# Patient Record
Sex: Male | Born: 1985 | Race: White | Hispanic: No | Marital: Single | Smoking: Former smoker
Health system: Southern US, Community
[De-identification: ages and names within clinical notes are randomized; demographics above are authoritative.]

## PROBLEM LIST (undated history)

## (undated) DIAGNOSIS — E039 Hypothyroidism, unspecified: Secondary | ICD-10-CM

## (undated) DIAGNOSIS — F4310 Post-traumatic stress disorder, unspecified: Secondary | ICD-10-CM

## (undated) DIAGNOSIS — I4891 Unspecified atrial fibrillation: Secondary | ICD-10-CM

## (undated) HISTORY — DX: Hypothyroidism, unspecified: E03.9

---

## 2012-06-22 ENCOUNTER — Ambulatory Visit: Payer: Self-pay | Admitting: Family Medicine

## 2012-06-26 ENCOUNTER — Other Ambulatory Visit: Payer: Self-pay | Admitting: *Deleted

## 2012-06-26 ENCOUNTER — Ambulatory Visit (INDEPENDENT_AMBULATORY_CARE_PROVIDER_SITE_OTHER): Admitting: Family Medicine

## 2012-06-26 ENCOUNTER — Encounter: Payer: Self-pay | Admitting: Family Medicine

## 2012-06-26 VITALS — BP 120/64 | HR 75 | Temp 98.1°F | Ht 75.0 in | Wt 258.8 lb

## 2012-06-26 DIAGNOSIS — Z0000 Encounter for general adult medical examination without abnormal findings: Secondary | ICD-10-CM

## 2012-06-26 DIAGNOSIS — Z13220 Encounter for screening for lipoid disorders: Secondary | ICD-10-CM

## 2012-06-26 DIAGNOSIS — Z131 Encounter for screening for diabetes mellitus: Secondary | ICD-10-CM

## 2012-06-26 LAB — BASIC METABOLIC PANEL
BUN: 16 mg/dL (ref 6–23)
CO2: 27 mEq/L (ref 19–32)
Chloride: 105 mEq/L (ref 96–112)
Creatinine, Ser: 1 mg/dL (ref 0.4–1.5)

## 2012-06-26 LAB — LIPID PANEL
LDL Cholesterol: 54 mg/dL (ref 0–99)
Total CHOL/HDL Ratio: 2
Triglycerides: 41 mg/dL (ref 0.0–149.0)
VLDL: 8.2 mg/dL (ref 0.0–40.0)

## 2012-06-26 NOTE — Telephone Encounter (Signed)
Opened in error

## 2012-06-26 NOTE — Progress Notes (Signed)
[facility] at [facility] [address] [place] [address] Phone: [phone] Fax: [phone]  Date:  06/26/2012   Name:  Terry Reilly   DOB:  [birth year]   MRN:  [id number] Gender: male Age: 26 y.o.  PCP:  Hannah Beat, MD  Evaluating MD: Hannah Beat, MD   Chief Complaint: Establish Care   History of Present Illness:  Terry Reilly is a 26 y.o. pleasant patient who presents with the following:  Works [employer], push-ups.   Preventative Health Maintenance Visit:  Health Maintenance Summary Reviewed and updated, unless pt declines services.  Tobacco History Reviewed. Rare dip Alcohol: No concerns, no excessive use Exercise Habits: Some activity, rec at least 30 mins 5 times a week STD concerns: no risk or activity to increase risk Drug Use: None Encouraged self-testicular check   No past medical history on file.  No past surgical history on file.  History  Substance Use Topics  . Smoking status: Never Smoker   . Smokeless tobacco: Not on file  . Alcohol Use: No    No family history on file.  No Known Allergies  Medication list has been reviewed and updated.  No outpatient prescriptions prior to visit.    Last reviewed on 06/26/2012  9:08 AM by Consuello Masse, CMA  Review of Systems:   General: Denies fever, chills, sweats. No significant weight loss. Eyes: Denies blurring,significant itching ENT: Denies earache, sore throat, and hoarseness. Cardiovascular: Denies chest pains, palpitations, dyspnea on exertion Respiratory: Denies cough, dyspnea at rest,wheeezing Breast: no concerns about lumps GI: Denies nausea, vomiting, diarrhea, constipation, change in bowel habits, abdominal pain, melena, hematochezia GU: Denies penile discharge, ED, urinary flow / outflow problems. No STD concerns. Musculoskeletal: Denies back pain, joint pain Derm: Denies rash, itching Neuro: Denies  paresthesias, frequent  falls, frequent headaches Psych: Denies depression, anxiety Endocrine: Denies cold intolerance, heat intolerance, polydipsia Heme: Denies enlarged lymph nodes Allergy: No hayfever  Physical Examination: Filed Vitals:   06/26/12 0905  BP: 120/64  Pulse: 75  Temp: 98.1 F (36.7 C)  TempSrc: Oral  Height: 6\' 3"  (1.905 m)  Weight: 258 lb 12 oz (117.368 kg)  SpO2: 97%    Body mass index is 32.34 kg/(m^2). Ideal Body Weight: Weight in (lb) to have BMI = 25: 199.6   GEN: well developed, well nourished, no acute distress Eyes: conjunctiva and lids normal, PERRLA, EOMI ENT: TM clear, nares clear, oral exam WNL Neck: supple, no lymphadenopathy, no thyromegaly, no JVD Pulm: clear to auscultation and percussion, respiratory effort normal CV: regular rate and rhythm, S1-S2, no murmur, rub or gallop, no bruits, peripheral pulses normal and symmetric, no cyanosis, clubbing, edema or varicosities GI: soft, non-tender; no hepatosplenomegaly, masses; active bowel sounds all quadrants GU: no hernia, testicular mass, penile discharge Lymph: no cervical, axillary or inguinal adenopathy MSK: gait normal, muscle tone and strength WNL, no joint swelling, effusions, discoloration, crepitus  SKIN: clear, good turgor, color WNL, no rashes, lesions, or ulcerations Neuro: normal mental status, normal strength, sensation, and motion Psych: alert; oriented to person, place and time, normally interactive and not anxious or depressed in appearance.   Assessment and Plan:  1. Routine general medical examination at a health care facility    2. Screening for diabetes mellitus  Basic metabolic panel  3. Screening for lipoid disorders  Lipid panel   The patient's preventative maintenance and recommended screening tests for an annual wellness exam were reviewed in full  today. Brought up to date unless services declined.  Counselled on the importance of diet, exercise, and its role in overall health and  mortality. The patient's FH and SH was reviewed, including their home life, tobacco status, and drug and alcohol status.   Doing well.  Orders Today:  Orders Placed This Encounter  Procedures  . Basic metabolic panel  . Lipid panel    Updated Medication List: (Includes new medications, updates to list, dose adjustments) No orders of the defined types were placed in this encounter.    Medications Discontinued: There are no discontinued medications.   Hannah Beat, MD

## 2012-06-27 ENCOUNTER — Encounter: Payer: Self-pay | Admitting: *Deleted

## 2012-09-04 ENCOUNTER — Ambulatory Visit (INDEPENDENT_AMBULATORY_CARE_PROVIDER_SITE_OTHER): Admitting: Family Medicine

## 2012-09-04 ENCOUNTER — Encounter: Payer: Self-pay | Admitting: Family Medicine

## 2012-09-04 VITALS — BP 124/70 | HR 90 | Temp 98.4°F | Ht 75.0 in | Wt 261.8 lb

## 2012-09-04 DIAGNOSIS — B078 Other viral warts: Secondary | ICD-10-CM

## 2012-09-04 NOTE — Patient Instructions (Addendum)
Compound W or other Salicyclic acid over the counter topically

## 2012-09-05 NOTE — Progress Notes (Signed)
   [facility] [address] [place] [address] Phone: [phone] Fax: [phone]  Date:  09/04/2012   Name:  Terry Reilly   DOB:  08-29-1985   MRN:  [id number] Gender: male Age: 27 y.o.  Primary Physician:  Hannah Beat, MD  Evaluating MD: Hannah Beat, MD   Chief Complaint: wart on right hand  3 common warts, R hand  Cryotherapy  Reason: pain, irritation with activity Location: R space between 1st and 2nd is the largest and 2 other very small on R digits  Liquid nitrogen was applied using the liquid nitrogen gun without difficulty with an otoscope tip for concentration. Tolerated well without complications.  After open wounds heal, can use salicyclic acid

## 2012-09-09 ENCOUNTER — Emergency Department: Payer: Self-pay | Admitting: Emergency Medicine

## 2014-01-15 ENCOUNTER — Ambulatory Visit: Payer: Self-pay | Admitting: Emergency Medicine

## 2014-01-15 LAB — DOT URINE DIP
BLOOD: NEGATIVE
GLUCOSE, UR: NEGATIVE mg/dL (ref 0–75)
PROTEIN: NEGATIVE
SPECIFIC GRAVITY: 1.025 (ref 1.003–1.030)

## 2014-11-15 ENCOUNTER — Encounter (HOSPITAL_COMMUNITY): Payer: Self-pay

## 2014-11-15 ENCOUNTER — Emergency Department (HOSPITAL_COMMUNITY)
Admission: EM | Admit: 2014-11-15 | Discharge: 2014-11-16 | Disposition: A | Discharge status: Other Acute Inpatient Hospital | Attending: Emergency Medicine | Admitting: Emergency Medicine

## 2014-11-15 DIAGNOSIS — Z720 Tobacco use: Secondary | ICD-10-CM | POA: Insufficient documentation

## 2014-11-15 DIAGNOSIS — F3010 Manic episode without psychotic symptoms, unspecified: Secondary | ICD-10-CM

## 2014-11-15 DIAGNOSIS — F312 Bipolar disorder, current episode manic severe with psychotic features: Secondary | ICD-10-CM | POA: Diagnosis not present

## 2014-11-15 DIAGNOSIS — Z79899 Other long term (current) drug therapy: Secondary | ICD-10-CM | POA: Insufficient documentation

## 2014-11-15 DIAGNOSIS — Z046 Encounter for general psychiatric examination, requested by authority: Secondary | ICD-10-CM | POA: Diagnosis present

## 2014-11-15 HISTORY — DX: Post-traumatic stress disorder, unspecified: F43.10
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
## 2014-11-16 DIAGNOSIS — F312 Bipolar disorder, current episode manic severe with psychotic features: Secondary | ICD-10-CM | POA: Diagnosis present

## 2014-11-16 LAB — CBC
HCT: 47.1 % (ref 39.0–52.0)
HEMOGLOBIN: 16.5 g/dL (ref 13.0–17.0)
MCH: 29.8 pg (ref 26.0–34.0)
MCHC: 35 g/dL (ref 30.0–36.0)
MCV: 85.2 fL (ref 78.0–100.0)
Platelets: 269 10*3/uL (ref 150–400)
RBC: 5.53 MIL/uL (ref 4.22–5.81)
RDW: 13.1 % (ref 11.5–15.5)
WBC: 6.8 10*3/uL (ref 4.0–10.5)

## 2014-11-16 LAB — COMPREHENSIVE METABOLIC PANEL
ALT: 27 U/L (ref 17–63)
ANION GAP: 7 (ref 5–15)
AST: 25 U/L (ref 15–41)
Albumin: 5 g/dL (ref 3.5–5.0)
Alkaline Phosphatase: 49 U/L (ref 38–126)
BILIRUBIN TOTAL: 1.1 mg/dL (ref 0.3–1.2)
BUN: 19 mg/dL (ref 6–20)
CO2: 25 mmol/L (ref 22–32)
CREATININE: 0.99 mg/dL (ref 0.61–1.24)
Calcium: 9.5 mg/dL (ref 8.9–10.3)
Chloride: 106 mmol/L (ref 101–111)
GFR calc Af Amer: >60 mL/min (ref >60)
Glucose, Bld: 103 mg/dL — ABNORMAL HIGH (ref 70–99)
Potassium: 3.7 mmol/L (ref 3.5–5.1)
Sodium: 138 mmol/L (ref 135–145)
Total Protein: 8.1 g/dL (ref 6.5–8.1)

## 2014-11-16 LAB — RAPID URINE DRUG SCREEN, HOSP PERFORMED
Amphetamines: NOT DETECTED
Barbiturates: NOT DETECTED
Benzodiazepines: NOT DETECTED
Cocaine: NOT DETECTED
Opiates: NOT DETECTED
Tetrahydrocannabinol: NOT DETECTED

## 2014-11-16 LAB — ETHANOL: Alcohol, Ethyl (B): <5 mg/dL (ref <5)

## 2014-11-16 LAB — SALICYLATE LEVEL

## 2014-11-16 LAB — ACETAMINOPHEN LEVEL: Acetaminophen (Tylenol), Serum: <10 ug/mL — ABNORMAL LOW (ref 10–30)

## 2014-11-16 MED ORDER — ZIPRASIDONE MESYLATE 20 MG IM SOLR: 20.0000 mg | Freq: Once | INTRAMUSCULAR | Status: DC

## 2014-11-16 MED ORDER — LORAZEPAM 2 MG/ML IJ SOLN: 2.0000 mg | Freq: Once | INTRAMUSCULAR | Status: DC

## 2014-11-16 MED ORDER — ONDANSETRON HCL 4 MG PO TABS: 4.0000 mg | ORAL_TABLET | Freq: Three times a day (TID) | ORAL | Status: DC | PRN

## 2014-11-16 MED ORDER — LORAZEPAM 1 MG PO TABS: 1.0000 mg | ORAL_TABLET | Freq: Three times a day (TID) | ORAL | Status: DC | PRN

## 2014-11-16 MED ORDER — LURASIDONE HCL 80 MG PO TABS
80.0000 mg | ORAL_TABLET | Freq: Every day | ORAL | Status: DC
  Administered 2014-11-16: 80 mg via ORAL
  Filled 2014-11-16 (×2): qty 1

## 2014-11-16 MED ORDER — DIVALPROEX SODIUM 500 MG PO DR TAB: 500.0000 mg | DELAYED_RELEASE_TABLET | Freq: Two times a day (BID) | ORAL | Status: DC

## 2014-11-16 MED ORDER — ACETAMINOPHEN 325 MG PO TABS: 650.0000 mg | ORAL_TABLET | ORAL | Status: DC | PRN

## 2014-11-16 MED ORDER — IBUPROFEN 200 MG PO TABS: 600.0000 mg | ORAL_TABLET | Freq: Three times a day (TID) | ORAL | Status: DC | PRN

## 2014-11-16 MED ORDER — NICOTINE 21 MG/24HR TD PT24: 21.0000 mg | MEDICATED_PATCH | Freq: Every day | TRANSDERMAL | Status: DC

## 2014-11-16 MED ORDER — ZOLPIDEM TARTRATE 5 MG PO TABS
5.0000 mg | ORAL_TABLET | Freq: Every evening | ORAL | Status: DC | PRN
  Administered 2014-11-16: 5 mg via ORAL
  Filled 2014-11-16: qty 1

## 2014-11-16 MED ORDER — DIPHENHYDRAMINE HCL 50 MG/ML IJ SOLN: 50.0000 mg | Freq: Once | INTRAMUSCULAR | Status: DC

## 2014-11-16 NOTE — ED Notes (Signed)
Pt. To SAPPU from ED ambulatory without difficulty, to room  . Report from RN GrenadaBrittany RN. Pt. Is alert and oriented, warm and dry in no distress. Pt. Denies SI, HI, and AVH. Pt. Calm and cooperative. Pt. Made aware of security cameras and Q15 minute rounds. Pt. Encouraged to let Nursing staff know of any concerns or needs.

## 2014-11-16 NOTE — BH Assessment (Signed)
Spoke with Marena ChancyBianca at [facility] who reported that pt has been accepted to the services of Dr. Harlin RainMazzaglla. Nurse to nurse report can be called into [phone]. Pt can be transported after 10 am.

## 2014-11-16 NOTE — ED Notes (Signed)
On the phone 

## 2014-11-16 NOTE — ED Notes (Addendum)
Sheriff is here,  Pt  Calm cooperative, ambulatory w/o difficulty. IVC papers, EMTELA, MAR report , face sheet, transfer report, belongings, assessment notes given to [facility].  Patients dad--T. Ibrahim cell phone number :[phone]

## 2014-11-16 NOTE — ED Notes (Signed)
Message #2 left for sheriff about transport

## 2014-11-16 NOTE — ED Notes (Signed)
Up on the phone 
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
## 2014-11-16 NOTE — BH Assessment (Signed)
Per [name], Np pt meets inpt criteria. Sent referrals packets to: [place] [facility] [place], [place], [facility], and V[facility] ([place], [place], [place] Specific VA referrals packets will need to be completed in AM.    Clista BernhardtNancy Curlie Sittner, [facility] Triage Specialist 11/16/2014 5:40 AM

## 2014-11-16 NOTE — ED Notes (Signed)
Pt sleeping, easily aroused.  Pt reports that he thinks that the latuda is too strong, makes him shake and initially declined to take the full dose.  Pt then decided to take the full dose.  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  NAD, procedures explained.
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
## 2014-11-16 NOTE — ED Notes (Signed)
Pt. Noted sleeping in room. No complaints or concerns voiced. No distress or abnormal behavior noted. Will continue to monitor with security cameras. Q 15 minute rounds continue. 

## 2014-11-16 NOTE — ED Notes (Addendum)
Report given to Gary RN in SAPU 

## 2014-11-16 NOTE — ED Notes (Addendum)
Up to the bathroom.  Pt is aware that he is going to be transferred to [facility] and does not want to go there.  Pt reports that he just spent 2 weeks there and does not want to go back.  Catha NottinghamJamison NP aware and will talk w/ pt.

## 2014-11-30 ENCOUNTER — Emergency Department
Admission: EM | Admit: 2014-11-30 | Discharge: 2014-12-01 | Disposition: A | Discharge status: Home / Self Care | Attending: Emergency Medicine | Admitting: Emergency Medicine

## 2014-11-30 ENCOUNTER — Encounter: Payer: Self-pay | Admitting: Emergency Medicine

## 2014-11-30 DIAGNOSIS — F309 Manic episode, unspecified: Secondary | ICD-10-CM | POA: Diagnosis present

## 2014-11-30 DIAGNOSIS — F319 Bipolar disorder, unspecified: Secondary | ICD-10-CM | POA: Insufficient documentation

## 2014-11-30 DIAGNOSIS — Z720 Tobacco use: Secondary | ICD-10-CM | POA: Insufficient documentation

## 2014-11-30 DIAGNOSIS — F259 Schizoaffective disorder, unspecified: Secondary | ICD-10-CM | POA: Insufficient documentation

## 2014-11-30 HISTORY — DX: Unspecified atrial fibrillation: I48.91

## 2014-11-30 LAB — COMPREHENSIVE METABOLIC PANEL
ALK PHOS: 42 U/L (ref 38–126)
ALT: 21 U/L (ref 17–63)
AST: 25 U/L (ref 15–41)
Albumin: 4.8 g/dL (ref 3.5–5.0)
Anion gap: 8 (ref 5–15)
BUN: 16 mg/dL (ref 6–20)
CO2: 28 mmol/L (ref 22–32)
Calcium: 9.2 mg/dL (ref 8.9–10.3)
Chloride: 103 mmol/L (ref 101–111)
Creatinine, Ser: 0.97 mg/dL (ref 0.61–1.24)
GFR calc Af Amer: >60 mL/min (ref >60)
Glucose, Bld: 136 mg/dL — ABNORMAL HIGH (ref 65–99)
POTASSIUM: 3.6 mmol/L (ref 3.5–5.1)
SODIUM: 139 mmol/L (ref 135–145)
Total Bilirubin: 0.7 mg/dL (ref 0.3–1.2)
Total Protein: 7.9 g/dL (ref 6.5–8.1)

## 2014-11-30 LAB — CBC WITH DIFFERENTIAL/PLATELET
Basophils Absolute: 0 10*3/uL (ref 0–0.1)
Basophils Relative: 0 %
EOS PCT: 1 %
Eosinophils Absolute: 0 10*3/uL (ref 0–0.7)
HCT: 48.1 % (ref 40.0–52.0)
Hemoglobin: 16.2 g/dL (ref 13.0–18.0)
Lymphocytes Relative: 23 %
Lymphs Abs: 1.8 10*3/uL (ref 1.0–3.6)
MCH: 29.2 pg (ref 26.0–34.0)
MCHC: 33.7 g/dL (ref 32.0–36.0)
MCV: 86.5 fL (ref 80.0–100.0)
Monocytes Absolute: 0.7 10*3/uL (ref 0.2–1.0)
Monocytes Relative: 8 %
Neutro Abs: 5.5 10*3/uL (ref 1.4–6.5)
Neutrophils Relative %: 68 %
Platelets: 244 10*3/uL (ref 150–440)
RBC: 5.57 MIL/uL (ref 4.40–5.90)
RDW: 13.1 % (ref 11.5–14.5)
WBC: 8.1 10*3/uL (ref 3.8–10.6)

## 2014-11-30 LAB — ETHANOL: Alcohol, Ethyl (B): 40 mg/dL — ABNORMAL HIGH (ref <5)

## 2014-11-30 NOTE — ED Notes (Signed)
BEHAVIORAL HEALTH ROUNDING Patient sleeping: No. Patient alert and oriented: yes Behavior appropriate: No.; If no, describe: but can be demanding at times, requesting certain doctors, makes accusations Nutrition and fluids offered: No Toileting and hygiene offered: No Sitter present: yes Law enforcement present: Yes

## 2014-11-30 NOTE — ED Notes (Signed)
Pts father contact info: Tad, [phone]. Pt's friends gave contact info for [facility] [facility] [place] beh counselors: Remi DeterSamuel, [phone]. Denice ParadiseJohn Bigge, [phone]

## 2014-11-30 NOTE — ED Notes (Signed)

## 2014-11-30 NOTE — ED Notes (Signed)
Pt here for behav med eval.

## 2014-12-01 LAB — URINE DRUG SCREEN, QUALITATIVE (ARMC ONLY)
Amphetamines, Ur Screen: NOT DETECTED
Barbiturates, Ur Screen: NOT DETECTED
Benzodiazepine, Ur Scrn: NOT DETECTED
Cannabinoid 50 Ng, Ur ~~LOC~~: NOT DETECTED
Cocaine Metabolite,Ur ~~LOC~~: NOT DETECTED
MDMA (ECSTASY) UR SCREEN: NOT DETECTED
Methadone Scn, Ur: NOT DETECTED
OPIATE, UR SCREEN: NOT DETECTED
Phencyclidine (PCP) Ur S: NOT DETECTED
Tricyclic, Ur Screen: NOT DETECTED

## 2014-12-01 LAB — URINALYSIS COMPLETE WITH MICROSCOPIC (ARMC ONLY)
BACTERIA UA: NONE SEEN
Bilirubin Urine: NEGATIVE
Glucose, UA: NEGATIVE mg/dL
Hgb urine dipstick: NEGATIVE
Ketones, ur: NEGATIVE mg/dL
NITRITE: NEGATIVE
Protein, ur: NEGATIVE mg/dL
Specific Gravity, Urine: 1.026 (ref 1.005–1.030)
pH: 6 (ref 5.0–8.0)

## 2014-12-01 MED ORDER — QUETIAPINE FUMARATE ER 200 MG PO TB24
ORAL_TABLET | ORAL | Status: AC
  Administered 2014-12-01: 400 mg via ORAL
  Filled 2014-12-01: qty 2

## 2014-12-01 MED ORDER — QUETIAPINE FUMARATE ER 200 MG PO TB24
400.0000 mg | ORAL_TABLET | Freq: Every day | ORAL | Status: DC
  Administered 2014-12-01: 400 mg via ORAL

## 2014-12-01 MED ORDER — QUETIAPINE FUMARATE 400 MG PO TABS: 400.0000 mg | ORAL_TABLET | Freq: Every day | ORAL | Status: DC

## 2014-12-01 NOTE — ED Notes (Signed)
[facility]. Report to PenngroveMatt, Charity fundraiserN.

## 2014-12-01 NOTE — ED Notes (Signed)

## 2014-12-01 NOTE — ED Notes (Signed)

## 2014-12-01 NOTE — ED Notes (Signed)
Pt requesting a snack. Pt quiet, cooperative and friendly

## 2014-12-01 NOTE — BH Assessment (Signed)
Writer attempted to assess the patient.  Patient refused to participate.  Patient reports that he will only speak to the Psychiatrist.

## 2014-12-01 NOTE — ED Notes (Signed)
BEHAVIORAL HEALTH ROUNDING Patient sleeping: No. Patient alert and oriented: yes Behavior appropriate: Yes.  ; If no, describe:  Nutrition and fluids offered: {YES Toileting and hygiene offered: {YES  Sitter present: {YES  Law enforcement present: {YES 

## 2014-12-01 NOTE — ED Notes (Signed)
BEHAVIORAL HEALTH ROUNDING Patient sleeping: No. Patient alert and oriented: yes Behavior appropriate: Yes.  ;  Nutrition and fluids offered: Yes  Toileting and hygiene offered: Yes  Sitter present: not applicable Law enforcement present: Yes   

## 2014-12-01 NOTE — ED Notes (Signed)
Pt given my phone for long distance calls to his family. Awaiting psy for consult. Pt had a shower and a snack

## 2014-12-01 NOTE — Consult Note (Signed)
[facility] Consult   Reason for Consult:  For Follow up Referring Physician:  ER Patient Identification: Terry Reilly MRN:  [id number] Principal Diagnosis: <principal problem not specified> Diagnosis:   Patient Active Problem List   Diagnosis Date Noted  . Bipolar affective disorder, currently manic, severe, with psychotic features [F31.2] 11/16/2014    Total Time spent with patient: 45 minutes  Subjective:   Terry Reilly is a 29 y.o. male patient admitted with " meds not working."  HPI:  Pt reports that he was admitted at [facility] twice for 2 wks each time on IVC and that they Gave him meds that did not help and that he does not trust a Insurance underwriter. HPI Elements:   Pt was discharged on Latuda 80 mgs po hs and Depakote 250 mgs 3 tabs that do not work and that  He was on Seroquel that helped him the best and they would not listen to him.  Past Medical History:  Past Medical History  Diagnosis Date  . PTSD (post-traumatic stress disorder)   . Atrial fibrillation    No past surgical history on file. Family History: No family history on file. Social History:  History  Alcohol Use  . Yes    Comment: socially      History  Drug Use No    History   Social History  . Marital Status: Single    Spouse Name: N/A  . Number of Children: N/A  . Years of Education: N/A   Social History Main Topics  . Smoking status: Current Some Day Smoker  . Smokeless tobacco: Not on file  . Alcohol Use: Yes     Comment: socially   . Drug Use: No  . Sexual Activity: Not on file   Other Topics Concern  . Not on file   Social History Narrative   Additional Social History:                          Allergies:  No Known Allergies  Labs:  Results for orders placed or performed during the hospital encounter of 11/30/14 (from the past 48 hour(s))  CBC WITH DIFFERENTIAL     Status: None   Collection Time: 11/30/14  9:01 PM  Result  Value Ref Range   WBC 8.1 3.8 - 10.6 K/uL   RBC 5.57 4.40 - 5.90 MIL/uL   Hemoglobin 16.2 13.0 - 18.0 g/dL   HCT 48.1 40.0 - 52.0 %   MCV 86.5 80.0 - 100.0 fL   MCH 29.2 26.0 - 34.0 pg   MCHC 33.7 32.0 - 36.0 g/dL   RDW 13.1 11.5 - 14.5 %   Platelets 244 150 - 440 K/uL   Neutrophils Relative % 68 %   Neutro Abs 5.5 1.4 - 6.5 K/uL   Lymphocytes Relative 23 %   Lymphs Abs 1.8 1.0 - 3.6 K/uL   Monocytes Relative 8 %   Monocytes Absolute 0.7 0.2 - 1.0 K/uL   Eosinophils Relative 1 %   Eosinophils Absolute 0.0 0 - 0.7 K/uL   Basophils Relative 0 %   Basophils Absolute 0.0 0 - 0.1 K/uL  Comprehensive metabolic panel     Status: Abnormal   Collection Time: 11/30/14  9:01 PM  Result Value Ref Range   Sodium 139 135 - 145 mmol/L   Potassium 3.6 3.5 - 5.1 mmol/L   Chloride 103 101 - 111 mmol/L   CO2 28 22 -  32 mmol/L   Glucose, Bld 136 (H) 65 - 99 mg/dL   BUN 16 6 - 20 mg/dL   Creatinine, Ser 0.97 0.61 - 1.24 mg/dL   Calcium 9.2 8.9 - 10.3 mg/dL   Total Protein 7.9 6.5 - 8.1 g/dL   Albumin 4.8 3.5 - 5.0 g/dL   AST 25 15 - 41 U/L   ALT 21 17 - 63 U/L   Alkaline Phosphatase 42 38 - 126 U/L   Total Bilirubin 0.7 0.3 - 1.2 mg/dL   GFR calc non Af Amer >60 >60 mL/min   GFR calc Af Amer >60 >60 mL/min    Comment: (NOTE) The eGFR has been calculated using the CKD EPI equation. This calculation has not been validated in all clinical situations. eGFR's persistently <60 mL/min signify possible Chronic Kidney Disease.    Anion gap 8 5 - 15  Ethanol     Status: Abnormal   Collection Time: 11/30/14  9:01 PM  Result Value Ref Range   Alcohol, Ethyl (B) 40 (H) <5 mg/dL    Comment:        LOWEST DETECTABLE LIMIT FOR SERUM ALCOHOL IS 11 mg/dL FOR MEDICAL PURPOSES ONLY   Urine Drug Screen, Qualitative [facility])     Status: None   Collection Time: 12/01/14  2:23 AM  Result Value Ref Range   Tricyclic, Ur Screen NONE DETECTED NONE DETECTED   Amphetamines, Ur Screen NONE DETECTED NONE  DETECTED   MDMA (Ecstasy)Ur Screen NONE DETECTED NONE DETECTED   Cocaine Metabolite,Ur [place] NONE DETECTED NONE DETECTED   Opiate, Ur Screen NONE DETECTED NONE DETECTED   Phencyclidine (PCP) Ur S NONE DETECTED NONE DETECTED   Cannabinoid 50 Ng, Ur [place] NONE DETECTED NONE DETECTED   Barbiturates, Ur Screen NONE DETECTED NONE DETECTED   Benzodiazepine, Ur Scrn NONE DETECTED NONE DETECTED   Methadone Scn, Ur NONE DETECTED NONE DETECTED    Comment: (NOTE) 409  Tricyclics, urine               Cutoff 1000 ng/mL 200  Amphetamines, urine             Cutoff 1000 ng/mL 300  MDMA (Ecstasy), urine           Cutoff 500 ng/mL 400  Cocaine Metabolite, urine       Cutoff 300 ng/mL 500  Opiate, urine                   Cutoff 300 ng/mL 600  Phencyclidine (PCP), urine      Cutoff 25 ng/mL 700  Cannabinoid, urine              Cutoff 50 ng/mL 800  Barbiturates, urine             Cutoff 200 ng/mL 900  Benzodiazepine, urine           Cutoff 200 ng/mL 1000 Methadone, urine                Cutoff 300 ng/mL 1100 1200 The urine drug screen provides only a preliminary, unconfirmed 1300 analytical test result and should not be used for non-medical 1400 purposes. Clinical consideration and professional judgment should 1500 be applied to any positive drug screen result due to possible 1600 interfering substances. A more specific alternate chemical method 1700 must be used in order to obtain a confirmed analytical result.  1800 Gas chromato graphy / mass spectrometry (GC/MS) is the preferred 1900 confirmatory method.   Urinalysis complete, with microscopic (  [place])     Status: Abnormal   Collection Time: 12/01/14  2:23 AM  Result Value Ref Range   Color, Urine YELLOW (A) YELLOW   APPearance CLEAR (A) CLEAR   Glucose, UA NEGATIVE NEGATIVE mg/dL   Bilirubin Urine NEGATIVE NEGATIVE   Ketones, ur NEGATIVE NEGATIVE mg/dL   Specific Gravity, Urine 1.026 1.005 - 1.030   Hgb urine dipstick NEGATIVE NEGATIVE   pH 6.0 5.0 -  8.0   Protein, ur NEGATIVE NEGATIVE mg/dL   Nitrite NEGATIVE NEGATIVE   Leukocytes, UA TRACE (A) NEGATIVE   RBC / HPF 0-5 0 - 5 RBC/hpf   WBC, UA 0-5 0 - 5 WBC/hpf   Bacteria, UA NONE SEEN NONE SEEN   Squamous Epithelial / LPF 0-5 (A) NONE SEEN   Mucous PRESENT     Vitals: Blood pressure 113/71, pulse 80, temperature 97.9 F (36.6 C), temperature source Oral, resp. rate 18, height '6\' 3"'  (1.905 m), weight 117.935 kg (260 lb), SpO2 100 %.  Risk to Self: Is patient at risk for suicide?: No, but patient needs Medical Clearance Risk to Others:   Prior Inpatient Therapy:   Prior Outpatient Therapy:    No current facility-administered medications for this encounter.   Current Outpatient Prescriptions  Medication Sig Dispense Refill  . lurasidone (LATUDA) 80 MG TABS tablet Take 80 mg by mouth daily with breakfast.      Musculoskeletal: Strength & Muscle Tone: within normal limits Gait & Station: normal Patient leans: N/A  Psychiatric Specialty Exam: Physical Exam  Review of Systems  Constitutional: Negative.   HENT: Negative.   Eyes: Negative.   Respiratory: Negative.   Cardiovascular: Negative.   Gastrointestinal: Negative.   Genitourinary: Negative.   Musculoskeletal: Negative.   Skin: Negative.   Neurological: Negative.   Endo/Heme/Allergies: Negative.   Psychiatric/Behavioral: Negative.     Blood pressure 113/71, pulse 80, temperature 97.9 F (36.6 C), temperature source Oral, resp. rate 18, height '6\' 3"'  (1.905 m), weight 117.935 kg (260 lb), SpO2 100 %.Body mass index is 32.5 kg/(m^2).  General Appearance: Casual  Eye Contact::  Fair  Speech:  Clear and Coherent  Volume:  Normal  Mood:  Irritable  Affect:  Constricted  Thought Process:  Circumstantial  Orientation:  Full (Time, Place, and Person)  Thought Content:  upset and irritable  Suicidal Thoughts:  No  Homicidal Thoughts:  No  Memory:  Immediate;   Good Recent;   Good Remote;   Good Intact   Judgement:  Fair  Insight:  Fair  Psychomotor Activity:  Normal  Concentration:  Fair  Recall:  Good  Fund of Knowledge:Good  Language: Good  Akathisia:  No  Handed:  Right  AIMS (if indicated):     Assets:  Wellsite geologist  ADL's:  Intact  Cognition: WNL  Sleep:      Medical Decision Making: Review of Psycho-Social Stressors (1), Decision to obtain old records (1) and Review of New Medication or Change in Dosage (2)  Treatment Plan Summary: Plan start pt on Seroquel 400 mgs po hs and give ist dose now and pt has an apt cn out pt basis  at [facility] on 12/02/2014 and wants to go there for help as he trusts them  Plan:  No evidence of imminent risk to self or others at present.   Disposition: as above  Almer Bushey K 12/01/2014 3:20 PM

## 2014-12-01 NOTE — ED Notes (Signed)
Pt's discharge ready but pt sound asleep. Was given seroquel 400mg  earlier. Will wait until pt awakes before allowing him to drive himsef home

## 2014-12-01 NOTE — ED Notes (Signed)
BEHAVIORAL HEALTH ROUNDING  Patient sleeping: Yes.  Patient alert and oriented: no  Behavior appropriate: Yes. ; If no, describe:  Nutrition and fluids offered: No  Toileting and hygiene offered: No  Sitter present: no  Law enforcement present: Yes   

## 2014-12-01 NOTE — ED Notes (Signed)
BEHAVIORAL HEALTH ROUNDING Patient sleeping: yes Patient alert and oriented: yes Behavior appropriate: Yes.  ;  Nutrition and fluids offered: Yes  Toileting and hygiene offered: Yes  Sitter present: not applicable Law enforcement present: Yes

## 2014-12-01 NOTE — BH Assessment (Signed)
Assessment Note  Terry SageBenjamin Thomas Reilly is an 29 y.o. male. He states that he reports to the ED due to his medication not being effective.  He is reported as having a history of bipolar and has a history of PTSD. He denied being depressed or anxious.  He denied having auditory or visual hallucinations.  He denied homicidal and suicidal ideation or intent. He demonstrates manic behaviors. He expresses delusions of grandeur. He expressed paranoid idaeations. He expressed that he deserved special privileges due to his family status.  He reports that he came in for medication only and that [facility].      Axis I: Bipolar, Manic Axis II: Deferred Axis III:  Past Medical History  Diagnosis Date   PTSD (post-traumatic stress disorder)    Atrial fibrillation    Axis IV: problems related to legal system/crime and problems related to social environment Axis V: 51-60 moderate symptoms  Past Medical History:  Past Medical History  Diagnosis Date   PTSD (post-traumatic stress disorder)    Atrial fibrillation     No past surgical history on file.  Family History: No family history on file.  Social History:  reports that he has been smoking.  He does not have any smokeless tobacco history on file. He reports that he drinks alcohol. He reports that he does not use illicit drugs.  Additional Social History:  Alcohol / Drug Use History of alcohol / drug use?: No history of alcohol / drug abuse  CIWA: CIWA-Ar BP: 113/71 mmHg Pulse Rate: 80 COWS:    Allergies: No Known Allergies  Home Medications:  (Not in a hospital admission)  OB/GYN Status:  No LMP for male patient.  General Assessment Data [facility] Assessment: In system Is this a Tele or Face-to-Face Assessment?: Face-to-Face Is this an Initial Assessment or a Re-assessment for this encounter?: Initial Assessment Marital status: Single Living Arrangements:  Non-relatives/Friends Can pt return to current living arrangement?: Yes Admission Status: Voluntary Is patient capable of signing voluntary admission?: Yes Referral Source: MD Insurance type: Tricare     Crisis Care Plan Living Arrangements: Non-relatives/Friends  Education Status Is patient currently in school?: No  Risk to self with the past 6 months Suicidal Ideation: No Has patient been a risk to self within the past 6 months prior to admission? : Other (comment) (Manic behaviors) Suicidal Intent: No Has patient had any suicidal intent within the past 6 months prior to admission? : Other (comment) Is patient at risk for suicide?:  (Unknown) Suicidal Plan?:  (Unknown) Has patient had any suicidal plan within the past 6 months prior to admission? :  (Unknown) What has been your use of drugs/alcohol within the last 12 months?: None Reported Previous Attempts/Gestures: No How many times?: 0 Triggers for Past Attempts: Unknown Intentional Self Injurious Behavior: None Family Suicide History: No Recent stressful life event(s): Legal Issues (Employment) Persecutory voices/beliefs?:  (None Reported) Depression:  (Denies) Substance abuse history and/or treatment for substance abuse?: No  Risk to Others within the past 6 months Homicidal Ideation: No Does patient have any lifetime risk of violence toward others beyond the six months prior to admission? : Yes (comment) Thoughts of Harm to Others: No Current Homicidal Intent: No Current Homicidal Plan: No Access to Homicidal Means: No Identified Victim: None reported Assessment of Violence: None Noted Does patient have access to weapons?: No Criminal Charges Pending?: Yes Describe Pending Criminal Charges: Felony Arson Does  patient have a court date: Yes Court Date: 12/17/14 Is patient on probation?: No  Psychosis Hallucinations: None noted Delusions: Grandiose Anson Fret)  Mental Status Report Appearance/Hygiene: In  scrubs Eye Contact: Fair Motor Activity: Hyperactivity Speech: Aggressive Level of Consciousness: Alert Mood: Irritable, Suspicious Affect: Irritable Anxiety Level: None Thought Processes: Tangential Judgement: Unable to Assess Orientation: Person, Place, Time, Situation                      Abuse/Neglect Assessment (Assessment to be complete while patient is alone) Physical Abuse: Denies Verbal Abuse: Denies Sexual Abuse: Denies Exploitation of patient/patient's resources: Denies Self-Neglect: Denies     Merchant navy officer (For Healthcare) Does patient have an advance directive?: No Would patient like information on creating an advanced directive?: No - patient declined information (per family)          Disposition:  Disposition Initial Assessment Completed for this Encounter: Yes Disposition of Patient:  (per Psychiatrist,Discharge and follow up with outpatient )  On Site Evaluation by:   Reviewed with Physician:    Theadora Rama 12/01/2014 4:23 PM

## 2014-12-01 NOTE — ED Notes (Signed)
Patient assigned to appropriate care area. Patient oriented to unit/care area: Informed that, for their safety, care areas are designed for safety and monitored by security cameras at all times; and visiting hours explained to patient. Patient verbalizes understanding, and verbal contract for safety obtained. 

## 2014-12-01 NOTE — ED Notes (Signed)
Pt still sleeping soundly. Sat up briefly and then lay back down. Awaiting him to become able to drive himself home.

## 2014-12-01 NOTE — ED Notes (Signed)
Pt assessed by psychiatry. Medicated as ordered and pt will be discharged.

## 2014-12-01 NOTE — ED Notes (Signed)
Unable to get the 2 hr assignment blank note to function. Pt is alert, had a visitor for 5 minutes. Believes that since his family has "connections" to the hospital he should have "leniecy with the rules and that his visitors should be able to visit as long as he wants them to. Pt angry when informed we have to follow rules.

## 2014-12-01 NOTE — Discharge Instructions (Signed)
Schizophrenia °Schizophrenia is a mental illness. It may cause disturbed or disorganized thinking, speech, or behavior. People with schizophrenia have problems functioning in one or more areas of life: work, school, home, or relationships. People with schizophrenia are at increased risk for suicide, certain chronic physical illnesses, and unhealthy behaviors, such as smoking and drug use. °People who have family members with schizophrenia are at higher risk of developing the illness. Schizophrenia affects men and women equally but usually appears at an earlier age (teenage or early adult years) in men.  °SYMPTOMS °The earliest symptoms are often subtle (prodrome) and may go unnoticed until the illness becomes more severe (first-break psychosis). Symptoms of schizophrenia may be continuous or may come and go in severity. Episodes often are triggered by major life events, such as family stress, college, military service, marriage, pregnancy or child birth, divorce, or loss of a loved one. People with schizophrenia may see, hear, or feel things that do not exist (hallucinations). They may have false beliefs in spite of obvious proof to the contrary (delusions). Sometimes speech is incoherent or behavior is odd or withdrawn.  °DIAGNOSIS °Schizophrenia is diagnosed through an assessment by your caregiver. Your caregiver will ask questions about your thoughts, behavior, mood, and ability to function in daily life. Your caregiver may ask questions about your medical history and use of alcohol or drugs, including prescription medication. Your caregiver may also order blood tests and imaging exams. Certain medical conditions and substances can cause symptoms that resemble schizophrenia. Your caregiver may refer you to a mental health specialist for evaluation. There are three major criterion for a diagnosis of schizophrenia: °· Two or more of the following five symptoms are present for a month or longer: °¨ Delusions. Often  the delusions are that you are being attacked, harassed, cheated, persecuted or conspired against (persecutory delusions). °¨ Hallucinations.   °¨ Disorganized speech that does not make sense to others. °¨ Grossly disorganized (confused or unfocused) behavior or extremely overactive or underactive motor activity (catatonia). °¨ Negative symptoms such as bland or blunted emotions (flat affect), loss of will power (avolition), and withdrawal from social contacts (social isolation). °· Level of functioning in one or more major areas of life (work, school, relationships, or self-care) is markedly below the level of functioning before the onset of illness.   °· There are continuous signs of illness (either mild symptoms or decreased level of functioning) for at least 6 months or longer. °TREATMENT  °Schizophrenia is a long-term illness. It is best controlled with continuous treatment rather than treatment only when symptoms occur. The following treatments are used to manage schizophrenia: °· Medication--Medication is the most effective and important form of treatment for schizophrenia. Antipsychotic medications are usually prescribed to help manage schizophrenia. Other types of medication may be added to relieve any symptoms that may occur despite the use of antipsychotic medications. °· Counseling or talk therapy--Individual, group, or family counseling may be helpful in providing education, support, and guidance. Many people with schizophrenia also benefit from social skills and job skills (vocational) training. °A combination of medication and counseling is best for managing the disorder over time. A procedure in which electricity is applied to the brain through the scalp (electroconvulsive therapy) may be used to treat catatonic schizophrenia or schizophrenia in people who cannot take or do not respond to medication and counseling. °Document Released: 06/25/2000 Document Revised: 02/28/2013 Document Reviewed:  09/20/2012 °Exit[employer]® Patient Information ©2015 [employer]. This information is not intended to replace advice given to you by   your health care provider. Make sure you discuss any questions you have with your health care provider.    Please follow-up at your psychiatrist as scheduled tomorrow 12/02/2014. Return to the emergency department if your having any thoughts of hurting herself or anyone else that we may attempt to help you.

## 2014-12-01 NOTE — ED Provider Notes (Signed)
-----------------------------------------   7:06 AM on 12/01/2014 -----------------------------------------   BP 152/91 mmHg  Pulse 88  Temp(Src) 98.3 F (36.8 C) (Oral)  Ht 6\' 3"  (1.905 m)  Wt 260 lb (117.935 kg)  BMI 32.50 kg/m2  SpO2 97%  No acute events overnight. Vitals reviewed.  Disposition is pending per Psychiatry/Behavioral Medicine team recommendations.     Jene Everyobert Loletha Bertini, MD 12/01/14 [phone]

## 2014-12-01 NOTE — ED Notes (Signed)
BEHAVIORAL HEALTH ROUNDING Patient sleeping: No. Patient alert and oriented: yes Behavior appropriate: Yes.  ; If no, describe:  Nutrition and fluids offered: Yes  Toileting and hygiene offered: Yes  Sitter present: no Law enforcement present: Yes  

## 2014-12-01 NOTE — ED Notes (Signed)
Pt eating breakfast calm and cooperative at this time.
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
## 2014-12-01 NOTE — ED Provider Notes (Signed)
-----------------------------------------   5:10 PM on 12/01/2014 -----------------------------------------  Dr. Guss Bundehalla has seen the patient and believe the patient is safe for discharge home. She recommends starting the patient on cerebellar 400 mg daily at bedtime. I will write a prescription for the patient and discharge him home. He has follow-up tomorrow at his outpatient psychiatrist. I discussed this with behavior health nurse/intake nurse, we will discharge the patient home with follow-up tomorrow.  Minna AntisKevin Brayden Betters, MD 12/01/14 [phone]

## 2014-12-01 NOTE — ED Notes (Signed)
Pt eating breakfast calm and cooperative at this time.  

## 2014-12-01 NOTE — ED Notes (Signed)
Intake consult in with pt.

## 2014-12-01 NOTE — ED Notes (Signed)
BEHAVIORAL HEALTH ROUNDING Patient sleeping: Yes.   Patient alert and oriented: not applicable Behavior appropriate: Yes.  ; If no, describe:  Nutrition and fluids offered: NO - pt sleeping Toileting and hygiene offered: NO - pt sleeping Sitter present: YES Law enforcement present: YES  Pt sleeping. No distress noted. 

## 2014-12-01 NOTE — ED Notes (Signed)
Pt resting eyes closed

## 2014-12-01 NOTE — ED Notes (Signed)
BEHAVIORAL HEALTH ROUNDING Patient sleeping: Yes.   Patient alert and oriented: not applicable Behavior appropriate: Yes.  ; If no, describe:  Nutrition and fluids offered: No Toileting and hygiene offered: No Sitter present: no Law enforcement present: Yes  

## 2016-01-01 ENCOUNTER — Ambulatory Visit (INDEPENDENT_AMBULATORY_CARE_PROVIDER_SITE_OTHER): Admitting: Physician Assistant

## 2016-01-01 VITALS — BP 128/80 | HR 84 | Temp 98.5°F | Resp 18 | Ht 74.5 in | Wt 307.0 lb

## 2016-01-01 DIAGNOSIS — Z130 Encounter for screening for diseases of the blood and blood-forming organs and certain disorders involving the immune mechanism: Secondary | ICD-10-CM

## 2016-01-01 DIAGNOSIS — M7989 Other specified soft tissue disorders: Secondary | ICD-10-CM | POA: Diagnosis not present

## 2016-01-01 DIAGNOSIS — Z1329 Encounter for screening for other suspected endocrine disorder: Secondary | ICD-10-CM | POA: Diagnosis not present

## 2016-01-01 DIAGNOSIS — Z13228 Encounter for screening for other metabolic disorders: Secondary | ICD-10-CM

## 2016-01-01 DIAGNOSIS — Z23 Encounter for immunization: Secondary | ICD-10-CM | POA: Diagnosis not present

## 2016-01-01 DIAGNOSIS — R21 Rash and other nonspecific skin eruption: Secondary | ICD-10-CM | POA: Diagnosis not present

## 2016-01-01 DIAGNOSIS — M799 Soft tissue disorder, unspecified: Secondary | ICD-10-CM

## 2016-01-01 DIAGNOSIS — Z0000 Encounter for general adult medical examination without abnormal findings: Secondary | ICD-10-CM | POA: Diagnosis not present

## 2016-01-01 DIAGNOSIS — Z113 Encounter for screening for infections with a predominantly sexual mode of transmission: Secondary | ICD-10-CM

## 2016-01-01 MED ORDER — CLOTRIMAZOLE-BETAMETHASONE 1-0.05 % EX CREA: 1.0000 "application " | TOPICAL_CREAM | Freq: Two times a day (BID) | CUTANEOUS | Status: DC

## 2016-01-01 NOTE — Patient Instructions (Addendum)
IF you received an x-ray today, you will receive an invoice from [facility]. Please contact [facility] at [phone] with questions or concerns regarding your invoice.   IF you received labwork today, you will receive an invoice from [facility]. Please contact [facility] at [phone] with questions or concerns regarding your invoice.   Our billing staff will not be able to assist you with questions regarding bills from these companies.  You will be contacted with the lab results as soon as they are available. The fastest way to get your results is to activate your My Chart account. Instructions are located on the last page of this paperwork. If you have not heard from us regarding the results in 2 weeks, please contact this office.    Please apply the cream as stated. I would like you to increase your exercise to 4 times per day for 30 minutes of cardio. I would like you to await contact for the referral to dermatology. Keeping you healthy  Get these tests  Blood pressure- Have your blood pressure checked once a year by your healthcare provider.  Normal blood pressure is 120/80.  Weight- Have your body mass index (BMI) calculated to screen for obesity.  BMI is a measure of body fat based on height and weight. You can also calculate your own BMI at https://www.west-esparza.com/www.nhlbisupport.com/bmi/.  Cholesterol- Have your cholesterol checked regularly starting at age 30, sooner may be necessary if you have diabetes, high blood pressure, if a family member developed heart diseases at an early age or if you smoke.   Chlamydia, HIV, and other sexual transmitted disease- Get screened each year until the age of 30 then within three months of each new sexual partner.  Diabetes- Have your blood sugar checked regularly if you have high blood pressure, high cholesterol, a family history of diabetes or if you are overweight.  Get these vaccines  Flu shot- Every  fall.  Tetanus shot- Every 10 years.  Menactra- Single dose; prevents meningitis.  Take these steps  Don't smoke- If you do smoke, ask your healthcare provider about quitting. For tips on how to quit, go to www.smokefree.gov or call 1-800-QUIT-NOW.  Be physically active- Exercise 5 days a week for at least 30 minutes.  If you are not already physically active start slow and gradually work up to 30 minutes of moderate physical activity.  Examples of moderate activity include walking briskly, mowing the yard, dancing, swimming bicycling, etc.  Eat a healthy diet- Eat a variety of healthy foods such as fruits, vegetables, low fat milk, low fat cheese, yogurt, lean meats, poultry, fish, beans, tofu, etc.  For more information on healthy eating, go to www.thenutritionsource.org  Drink alcohol in moderation- Limit alcohol intake two drinks or less a day.  Never drink and drive.  Dentist- Brush and floss teeth twice daily; visit your dentis twice a year.  Depression-Your emotional health is as important as your physical health.  If you're feeling down, losing interest in things you normally enjoy please talk with your healthcare provider.  Gun Safety- If you keep a gun in your home, keep it unloaded and with the safety lock on.  Bullets should be stored separately.  Helmet use- Always wear a helmet when riding a motorcycle, bicycle, rollerblading or skateboarding.  Safe sex- If you may be exposed to a sexually transmitted infection, use a condom  Seat belts- Seat bels can save your life; always wear one.  Smoke/Carbon Monoxide  detectors- These detectors need to be installed on the appropriate level of your home.  Replace batteries at least once a year.  Skin Cancer- When out in the sun, cover up and use sunscreen SPF 15 or higher.  Violence- If anyone is threatening or hurting you, please tell your healthcare provider.

## 2016-01-01 NOTE — Progress Notes (Signed)
[facility] [address], [place] [id number] [phone]  Date:  01/01/2016   Name:  Terry Reilly   DOB:  11/11/1985   MRN:  [id number]  PCP:  Hannah BeatSpencer Copland, MD    History of Present Illness:  Terry Reilly is a 30 y.o. male patient who [facility], and lesion of left foot.     This started a couple months ago. Itchy and scaling.  He has tried hydrocortisone without relief.  Diet: eat healthy foods, not too much fried foods.  Water intake is about a gallon.  No sodas.  Watching sweet tea intake.  BM: Normal. Metamucil may help.,  No blood in the stool or diarrhea.  Occasional constipation helped with metamucil  Urination: no hematuria, dysuria, or frequency.    Sleep: 7 no difficulty staying or getting to sleep  Social activity: working out at gym, [employer] golf, going to dinner EtOH: 1-2 drinks weekly Tobacco use: none Illicit drug use: none Sexually active: protected.     Patient Active Problem List   Diagnosis Date Noted  . Bipolar affective disorder, currently manic, severe, with psychotic features (HCC) 11/16/2014    Past Medical History  Diagnosis Date  . PTSD (post-traumatic stress disorder)   . Atrial fibrillation (HCC)     History reviewed. No pertinent past surgical history.  Social History  Substance Use Topics  . Smoking status: Former Games developermoker  . Smokeless tobacco: None  . Alcohol Use: 0.0 oz/week    0 Standard drinks or equivalent per week     Comment: socially     History reviewed. No pertinent family history.  No Known Allergies  Medication list has been reviewed and updated.  Current Outpatient Prescriptions on File Prior to Visit  Medication Sig Dispense Refill  . lurasidone (LATUDA) 80 MG TABS tablet Take 80 mg by mouth daily with breakfast. Reported on 01/01/2016    . QUEtiapine (SEROQUEL) 400 MG tablet Take 1 tablet (400 mg total) by mouth at bedtime. (Patient not  taking: Reported on 01/01/2016) 30 tablet 0   No current facility-administered medications on file prior to visit.    Review of Systems  Constitutional: Negative for fever and chills.  HENT: Negative for ear discharge, ear pain and sore throat.   Eyes: Negative for blurred vision and double vision.  Respiratory: Negative for cough, shortness of breath and wheezing.   Cardiovascular: Negative for chest pain, palpitations and leg swelling.  Gastrointestinal: Negative for nausea, vomiting and diarrhea.  Genitourinary: Negative for dysuria, frequency and hematuria.  Skin: Positive for rash. Negative for itching.  Neurological: Negative for dizziness and headaches.     Physical Examination: BP 128/80 mmHg  Pulse 84  Temp(Src) 98.5 F (36.9 C) (Oral)  Resp 18  Ht 6' 2.5" (1.892 m)  Wt 307 lb (139.254 kg)  BMI 38.90 kg/m2  SpO2 96% Ideal Body Weight: Weight in (lb) to have BMI = 25: 196.9  Physical Exam  Constitutional: He is oriented to person, place, and time. He appears well-developed and well-nourished. No distress.  HENT:  Head: Normocephalic and atraumatic.  Right Ear: Tympanic membrane, external ear and ear canal normal.  Left Ear: Tympanic membrane, external ear and ear canal normal.  Eyes: Conjunctivae and EOM are normal. Pupils are equal, round, and reactive to light.  Cardiovascular: Normal rate and regular rhythm.  Exam reveals no friction rub.   No murmur heard. Pulmonary/Chest: Effort normal. No respiratory  distress. He has no wheezes.  Abdominal: Soft. Bowel sounds are normal. He exhibits no distension and no mass. There is no tenderness.  Musculoskeletal: Normal range of motion. He exhibits no edema or tenderness.  Neurological: He is alert and oriented to person, place, and time. He displays normal reflexes.  Skin: Skin is warm and dry. He is not diaphoretic.  3RD right toe with superficial erythematous lesion.  Non-tender.  Warty-appearance. Lower left anterior  leg with erythematous annular patches scaling.  Psychiatric: He has a normal mood and affect. His behavior is normal.     Assessment and Plan: Terry Reilly is a 30 y.o. male who is here today lesion of foot.   Dermatology consult appreciated. This will likely need to be excised. He requested dermatology consult and this can be appropriate at this time. Checking general labs for annual physical.  Soft tissue lesion of foot - Plan: Ambulatory referral to Dermatology  Screening for deficiency anemia - Plan: CBC  Screening for metabolic disorder - Plan: COMPLETE METABOLIC PANEL WITH GFR  Screening for STD (sexually transmitted disease) - Plan: RPR, HIV antibody, GC/Chlamydia Probe Amp  Screening for thyroid disorder - Plan: TSH  Rash and nonspecific skin eruption - Plan: clotrimazole-betamethasone (LOTRISONE) cream  Need for Tdap vaccination - Plan: Tdap vaccine greater than or equal to 7yo IM  Trena PlattStephanie Lilia Letterman, PA-C [facility] 01/01/2016 5:50 PM

## 2016-01-02 LAB — CBC
HCT: 46.7 % (ref 38.5–50.0)
HEMOGLOBIN: 16.4 g/dL (ref 13.2–17.1)
MCH: 28.7 pg (ref 27.0–33.0)
MCHC: 35.1 g/dL (ref 32.0–36.0)
MCV: 81.6 fL (ref 80.0–100.0)
MPV: 9.7 fL (ref 7.5–12.5)
PLATELETS: 313 10*3/uL (ref 140–400)
RBC: 5.72 MIL/uL (ref 4.20–5.80)
RDW: 14.1 % (ref 11.0–15.0)
WBC: 7.2 10*3/uL (ref 3.8–10.8)

## 2016-01-02 LAB — COMPLETE METABOLIC PANEL WITH GFR
ALT: 61 U/L — ABNORMAL HIGH (ref 9–46)
AST: 39 U/L (ref 10–40)
Albumin: 4.7 g/dL (ref 3.6–5.1)
Alkaline Phosphatase: 55 U/L (ref 40–115)
BUN: 13 mg/dL (ref 7–25)
CHLORIDE: 101 mmol/L (ref 98–110)
CO2: 26 mmol/L (ref 20–31)
Calcium: 9.8 mg/dL (ref 8.6–10.3)
Creat: 1.22 mg/dL (ref 0.60–1.35)
GFR, Est African American: >89 mL/min (ref >=60)
GFR, Est Non African American: 79 mL/min (ref >=60)
GLUCOSE: 89 mg/dL (ref 65–99)
Potassium: 4.2 mmol/L (ref 3.5–5.3)
SODIUM: 137 mmol/L (ref 135–146)
Total Bilirubin: 0.7 mg/dL (ref 0.2–1.2)
Total Protein: 7.5 g/dL (ref 6.1–8.1)

## 2016-01-02 LAB — TSH: TSH: 63.76 mIU/L — ABNORMAL HIGH (ref 0.40–4.50)

## 2016-01-02 LAB — HIV ANTIBODY (ROUTINE TESTING W REFLEX): HIV 1&2 Ab, 4th Generation: NONREACTIVE

## 2016-01-03 LAB — RPR

## 2016-01-16 ENCOUNTER — Other Ambulatory Visit: Payer: Self-pay | Admitting: *Deleted

## 2016-01-16 DIAGNOSIS — Z113 Encounter for screening for infections with a predominantly sexual mode of transmission: Secondary | ICD-10-CM

## 2016-01-21 ENCOUNTER — Other Ambulatory Visit: Payer: Self-pay | Admitting: Physician Assistant

## 2016-01-21 DIAGNOSIS — R7989 Other specified abnormal findings of blood chemistry: Secondary | ICD-10-CM

## 2016-01-21 NOTE — Progress Notes (Unsigned)
Please schedule for ultrasound of thyroid.

## 2016-01-22 ENCOUNTER — Telehealth: Payer: Self-pay

## 2016-01-22 NOTE — Telephone Encounter (Signed)
-----   Message from Garnetta BuddyStephanie D English, [place] sent at 01/21/2016  8:22 AM EDT ----- Thyroid function is showing some hypothyroid.  This effects hormones, and may increase likelihood of weight gain.  We should check your antibodies.  I would like you to come in for a thyroid check.  Let's also do an ultrasound of the thyroid as well.  Have you had any recent illness?

## 2016-01-22 NOTE — Telephone Encounter (Signed)
Spoke with pt - gave message per Stephanie's note he was out of town [place]ologist[place] working and stated [place] to [place] he would call to make appointment. Explained the appointment process to pt.

## 2016-02-14 ENCOUNTER — Telehealth: Payer: Self-pay

## 2016-02-14 NOTE — Telephone Encounter (Signed)
Patient is trying to figure out why [facility] is calling him to schedule an ultrasound. Patient stated he feel fine. He want to know if the ultrasound is necessary. [phone]. Patient request to speak with Trena Platt. Patient is upset with [facility]. Stated the lady was laughing at him and wasn't taking him serious.

## 2016-02-16 NOTE — Telephone Encounter (Signed)
Spoke with pt, he wants to get the [facility] at [facility]. Can we see if he can go through [facility] and see around how much he will have to pay for the US.

## 2016-02-16 NOTE — Telephone Encounter (Signed)
I called [facility] and scheduled the patient with their next available time slot.  The patient is scheduled on 02/27/16 at 11:30am.  The patient will enter the facility through Admitting.  I left a message with the pre-service center at [phone] inquiring about cost, so I am waiting a return call regarding that.

## 2016-02-17 NOTE — Telephone Encounter (Signed)
I called the pre-service center again and spoke with an associate to receive an estimated cost.  With insurance, the estimated cost is $119.11 for the thyroid ultrasound.  I left a voicemail on the patient's phone with the scheduled appointment time and the estimated cost.  Again the appointnet is on 02/27/16 at 11:30am at [facility], enter through Admitting.

## 2016-02-27 ENCOUNTER — Ambulatory Visit (HOSPITAL_COMMUNITY)
Admission: RE | Admit: 2016-02-27 | Discharge: 2016-02-27 | Disposition: A | Discharge status: Home / Self Care | Source: Ambulatory Visit | Attending: Physician Assistant | Admitting: Physician Assistant

## 2016-02-27 DIAGNOSIS — R7989 Other specified abnormal findings of blood chemistry: Secondary | ICD-10-CM

## 2016-03-11 ENCOUNTER — Telehealth: Payer: Self-pay | Admitting: *Deleted

## 2016-03-11 ENCOUNTER — Telehealth: Payer: Self-pay

## 2016-03-11 DIAGNOSIS — R7989 Other specified abnormal findings of blood chemistry: Secondary | ICD-10-CM

## 2016-03-11 NOTE — Telephone Encounter (Signed)
Patient needs a copy of his dot long form. He would like to pick it up by tomorrow 03/12/16.   Please call when ready for pick up!  [phone]

## 2016-03-11 NOTE — Telephone Encounter (Signed)
Patient would like a copy of his DOT long form faxed to Attn: Ellie   [phone]

## 2016-03-12 NOTE — Telephone Encounter (Signed)
DOT issue escalated to Lorenza CambridgeNikki C.- Print production plannerffice Manager

## 2016-03-19 NOTE — Telephone Encounter (Signed)
I have placed future order of the thyroid panel.  Please also obtain the antibodies, and gc/ch(gc/ch if patient would like). Thanks!!

## 2016-03-19 NOTE — Telephone Encounter (Signed)
I have no idea what this is.  Please advise patient that this shows a hypothyroid like changes.  I would have liked to place him on a thyroid medication, but I need to see his antibodies, as I asked almost 2 months ago, to come by.  I would also add a thyroid panel.   BUT, I am going to go ahead and refer him to an endocrinologist.  There is a disconnect in returning, and we need to just get him straight with a specialist.  He can stop by so that these labs are in place for the consult. Please advise that his ultrasound showed chronic thyroiditis, but no mass. IMPRESSION: Mildly enlarged and diffusely heterogeneous thyroid gland suggesting chronic thyroiditis.  No discrete nodules.

## 2016-03-19 NOTE — Telephone Encounter (Signed)
Spoke with pt, gave results per [employer] Inc[employer]phanie's message. Pt did not understand he was to return for labs.  Will come by on Monday, 9/11 for labs - told pt it would be for labs only.  He has appt with Endocrinologist ono Oct 2.

## 2016-04-12 ENCOUNTER — Ambulatory Visit: Admitting: Endocrinology

## 2016-04-21 ENCOUNTER — Ambulatory Visit: Admitting: Endocrinology

## 2016-05-07 ENCOUNTER — Ambulatory Visit (INDEPENDENT_AMBULATORY_CARE_PROVIDER_SITE_OTHER): Admitting: Endocrinology

## 2016-05-07 ENCOUNTER — Encounter: Payer: Self-pay | Admitting: Endocrinology

## 2016-05-07 DIAGNOSIS — E039 Hypothyroidism, unspecified: Secondary | ICD-10-CM | POA: Insufficient documentation

## 2016-05-07 NOTE — Patient Instructions (Addendum)
blood tests are requested for you today.  We'll let you know about the results. If your thyroid is low again, I'll send a prescription to your pharmacy.        Hypothyroidism Hypothyroidism is a disorder of the thyroid. The thyroid is a large gland that is located in the lower front of the neck. The thyroid releases hormones that control how the body works. With hypothyroidism, the thyroid does not make enough of these hormones. CAUSES Causes of hypothyroidism may include:  Viral infections.  Pregnancy.  Your own defense system (immune system) attacking your thyroid.  Certain medicines.  Birth defects.  Past radiation treatments to your head or neck.  Past treatment with radioactive iodine.  Past surgical removal of part or all of your thyroid.  Problems with the gland that is located in the center of your brain (pituitary). SIGNS AND SYMPTOMS Signs and symptoms of hypothyroidism may include:  Feeling as though you have no energy (lethargy).  Inability to tolerate cold.  Weight gain that is not explained by a change in diet or exercise habits.  Dry skin.  Coarse hair.  Menstrual irregularity.  Slowing of thought processes.  Constipation.  Sadness or depression. DIAGNOSIS  Your health care provider may diagnose hypothyroidism with blood tests and ultrasound tests. TREATMENT Hypothyroidism is treated with medicine that replaces the hormones that your body does not make. After you begin treatment, it may take several weeks for symptoms to go away. HOME CARE INSTRUCTIONS   Take medicines only as directed by your health care provider.  If you start taking any new medicines, tell your health care provider.  Keep all follow-up visits as directed by your health care provider. This is important. As your condition improves, your dosage needs may change. You will need to have blood tests regularly so that your health care provider can watch your condition. SEEK  MEDICAL CARE IF:  Your symptoms do not get better with treatment.  You are taking thyroid replacement medicine and:  You sweat excessively.  You have tremors.  You feel anxious.  You lose weight rapidly.  You cannot tolerate heat.  You have emotional swings.  You have diarrhea.  You feel weak. SEEK IMMEDIATE MEDICAL CARE IF:   You develop chest pain.  You develop an irregular heartbeat.  You develop a rapid heartbeat.   This information is not intended to replace advice given to you by your health care provider. Make sure you discuss any questions you have with your health care provider.   Document Released: 06/28/2005 Document Revised: 07/19/2014 Document Reviewed: 11/13/2013 Elsevier Interactive Patient Education [employer] Elsevier Inc.

## 2016-05-07 NOTE — Progress Notes (Signed)
Subjective:    Patient ID: Terry Reilly, male    DOB: March 16, 1986, 30 y.o.   MRN: [id number]  HPI Pt reports hypothyroidism was dx'ed in mid-2017.  He has never been on prescribed thyroid hormone therapy.  He has never taken kelp or any other type of non-prescribed thyroid product. He has never had thyroid surgery, or XRT to the neck.  He has never been on amiodarone or lithium.  He has slight weight gain at the abdomen, but no assoc skin rash.  He says he does not have h/o AF.   Past Medical History:  Diagnosis Date  . Atrial fibrillation (HCC)   . Hypothyroid   . PTSD (post-traumatic stress disorder)     No past surgical history on file.  Social History   Social History  . Marital status: Single    Spouse name: N/A  . Number of children: N/A  . Years of education: N/A   Occupational History  . Not on file.   Social History Main Topics  . Smoking status: Former Games developer  . Smokeless tobacco: Not on file  . Alcohol use 0.0 oz/week     Comment: socially   . Drug use: No  . Sexual activity: Not on file   Other Topics Concern  . Not on file   Social History Narrative  . No narrative on file    No current outpatient prescriptions on file prior to visit.   No current facility-administered medications on file prior to visit.     No Known Allergies  Family History  Problem Relation Age of Onset  . Thyroid disease Neg Hx     BP 132/80   Pulse 83   Ht 6' 2.5" (1.892 m)   Wt (!) 305 lb (138.3 kg)   SpO2 97%   PF (!) 3 L/min   BMI 38.64 kg/m   Review of Systems denies depression, muscle cramps, sob, weight gain, constipation, numbness, blurry vision, cold intolerance, myalgias, dry skin, rhinorrhea, easy bruising, and syncope.       Objective:   Physical Exam VS: see vs page GEN: no distress HEAD: head: no deformity eyes: no periorbital swelling, no proptosis external nose and ears are normal mouth: no lesion seen NECK: supple, thyroid is not  enlarged CHEST WALL: no deformity LUNGS: clear to auscultation CV: reg rate and rhythm, no murmur ABD: abdomen is soft, nontender.  no hepatosplenomegaly.  not distended.  no hernia MUSCULOSKELETAL: muscle bulk and strength are grossly normal.  no obvious joint swelling.  gait is normal and steady EXTEMITIES: Trace bilat leg edema PULSES: no carotid bruit NEURO:  cn 2-12 grossly intact.   readily moves all 4's.  sensation is intact to touch on all 4's SKIN:  Normal texture and temperature.  No rash or suspicious lesion is visible.   NODES:  None palpable at the neck PSYCH: alert, well-oriented.  Does not appear anxious nor depressed.  Lab Results  Component Value Date   TSH 63.76 (H) 01/01/2016   [place]: Mildly enlarged and diffusely heterogeneous thyroid gland suggesting chronic thyroiditis.  No discrete nodules.  I have reviewed outside records, and summarized: Pt was seen for routine exam.  He reported h/o bipolar disorder, but no active sxs.      Assessment & Plan:  Chronic primary hypothyroidism, new to me Patient is advised the following:  Patient Instructions  blood tests are requested for you today.  We'll let you know about the results. If your thyroid is  low again, I'll send a prescription to your pharmacy.        Hypothyroidism Hypothyroidism is a disorder of the thyroid. The thyroid is a large gland that is located in the lower front of the neck. The thyroid releases hormones that control how the body works. With hypothyroidism, the thyroid does not make enough of these hormones. CAUSES Causes of hypothyroidism may include:  Viral infections.  Pregnancy.  Your own defense system (immune system) attacking your thyroid.  Certain medicines.  Birth defects.  Past radiation treatments to your head or neck.  Past treatment with radioactive iodine.  Past surgical removal of part or all of your thyroid.  Problems with the gland that is located in the center of  your brain (pituitary). SIGNS AND SYMPTOMS Signs and symptoms of hypothyroidism may include:  Feeling as though you have no energy (lethargy).  Inability to tolerate cold.  Weight gain that is not explained by a change in diet or exercise habits.  Dry skin.  Coarse hair.  Menstrual irregularity.  Slowing of thought processes.  Constipation.  Sadness or depression. DIAGNOSIS  Your health care provider may diagnose hypothyroidism with blood tests and ultrasound tests. TREATMENT Hypothyroidism is treated with medicine that replaces the hormones that your body does not make. After you begin treatment, it may take several weeks for symptoms to go away. HOME CARE INSTRUCTIONS   Take medicines only as directed by your health care provider.  If you start taking any new medicines, tell your health care provider.  Keep all follow-up visits as directed by your health care provider. This is important. As your condition improves, your dosage needs may change. You will need to have blood tests regularly so that your health care provider can watch your condition. SEEK MEDICAL CARE IF:  Your symptoms do not get better with treatment.  You are taking thyroid replacement medicine and:  You sweat excessively.  You have tremors.  You feel anxious.  You lose weight rapidly.  You cannot tolerate heat.  You have emotional swings.  You have diarrhea.  You feel weak. SEEK IMMEDIATE MEDICAL CARE IF:   You develop chest pain.  You develop an irregular heartbeat.  You develop a rapid heartbeat.   This information is not intended to replace advice given to you by your health care provider. Make sure you discuss any questions you have with your health care provider.   Document Released: 06/28/2005 Document Revised: 07/19/2014 Document Reviewed: 11/13/2013 Elsevier Interactive Patient Education [employer]2016 [employer]

## 2016-05-10 ENCOUNTER — Other Ambulatory Visit: Payer: Self-pay | Admitting: Endocrinology

## 2016-05-10 DIAGNOSIS — E039 Hypothyroidism, unspecified: Secondary | ICD-10-CM

## 2016-05-10 LAB — TSH: TSH: 6.24 u[IU]/mL — AB (ref 0.35–4.50)

## 2016-05-10 LAB — T4, FREE: FREE T4: 0.73 ng/dL (ref 0.60–1.60)

## 2016-05-10 MED ORDER — LEVOTHYROXINE SODIUM 50 MCG PO TABS: 50.0000 ug | ORAL_TABLET | Freq: Every day | ORAL | 5 refills | Status: AC

## 2016-06-16 ENCOUNTER — Other Ambulatory Visit

## 2016-09-14 ENCOUNTER — Telehealth: Payer: Self-pay | Admitting: Physician Assistant

## 2016-09-14 NOTE — Telephone Encounter (Signed)
Pt is coming in to pick up a copy of DOT card

## 2016-10-31 IMAGING — US US THYROID
1 series · 14 of 25 positions shown · non-contrast
Comparison: None.

CLINICAL DATA: 30-year-old male with hypothyroidism

EXAM:
THYROID ULTRASOUND
TECHNIQUE: Ultrasound examination of the thyroid gland and adjacent soft
tissues was performed.

[Series 1: us thyroid · 0.09mm/px · 48 acquisitions, 14 frames shown]
[im 1/48]
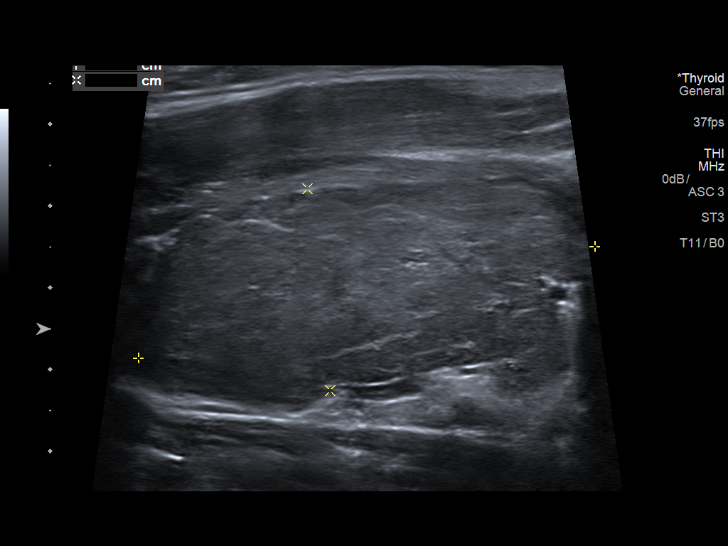
[im 4/48]
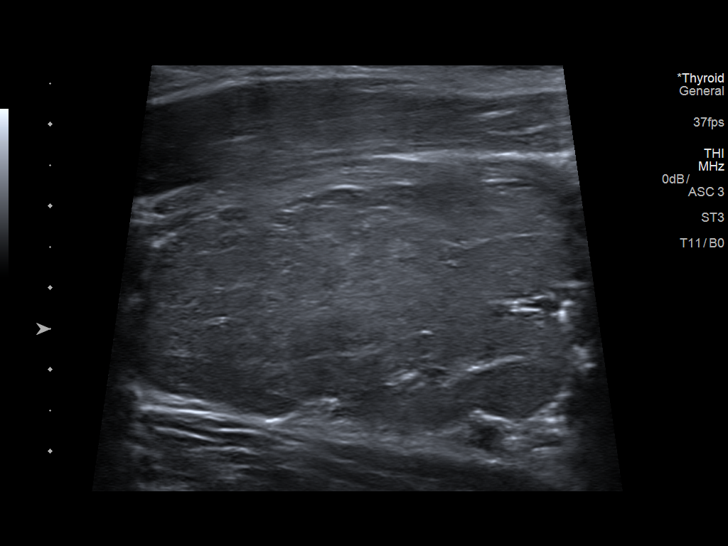
[im 8/48]
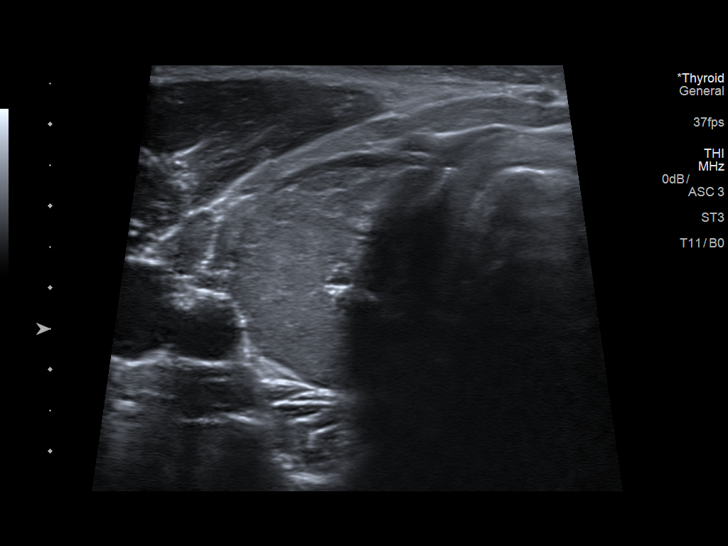
[im 12/48]
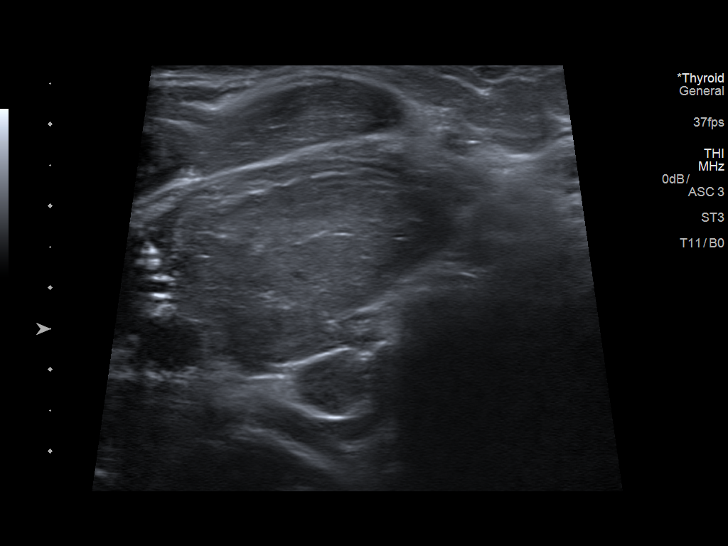
[im 16/48]
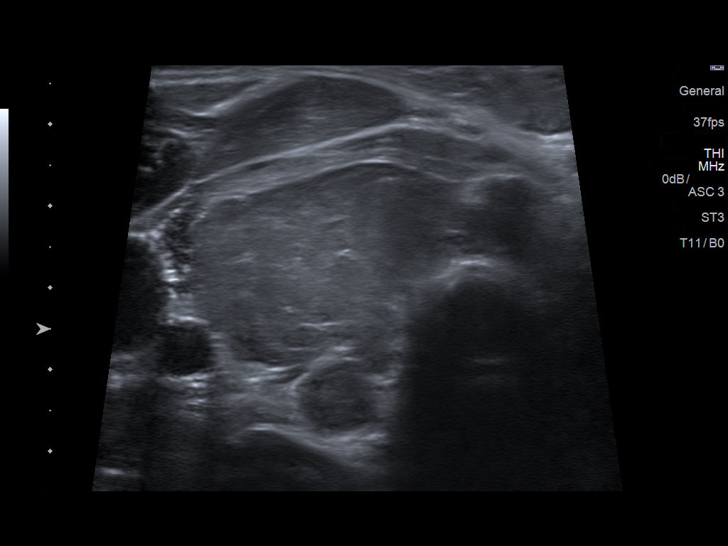
[im 18/48]
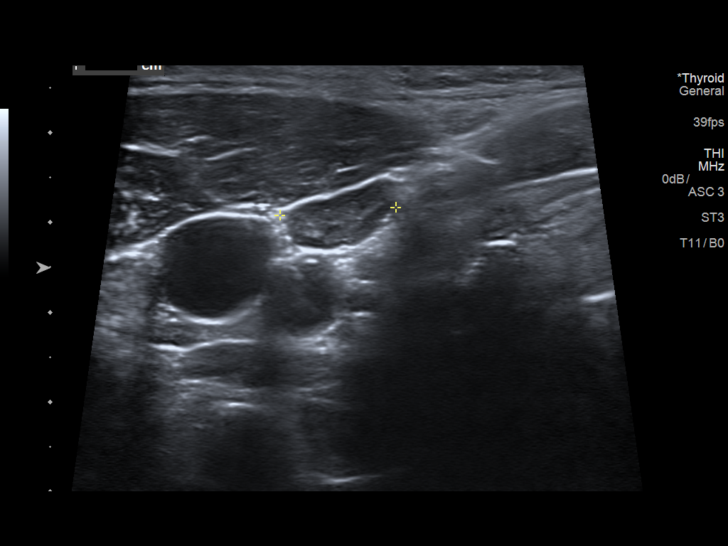
[im 22/48]
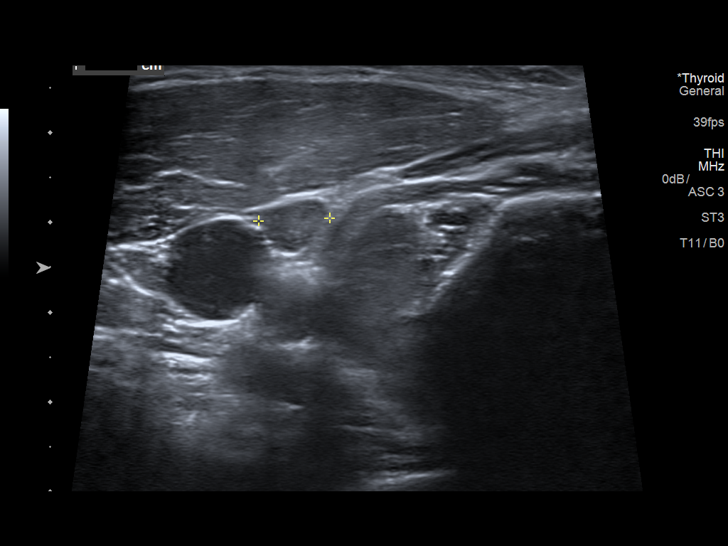
[im 26/48]
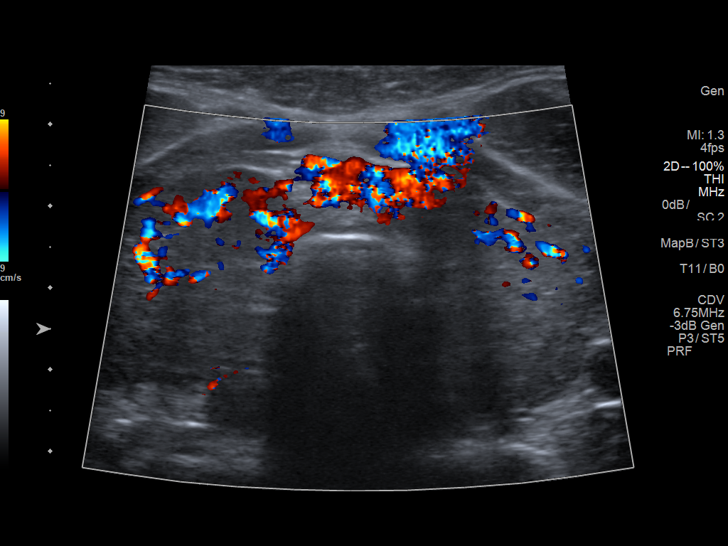
[im 30/48]
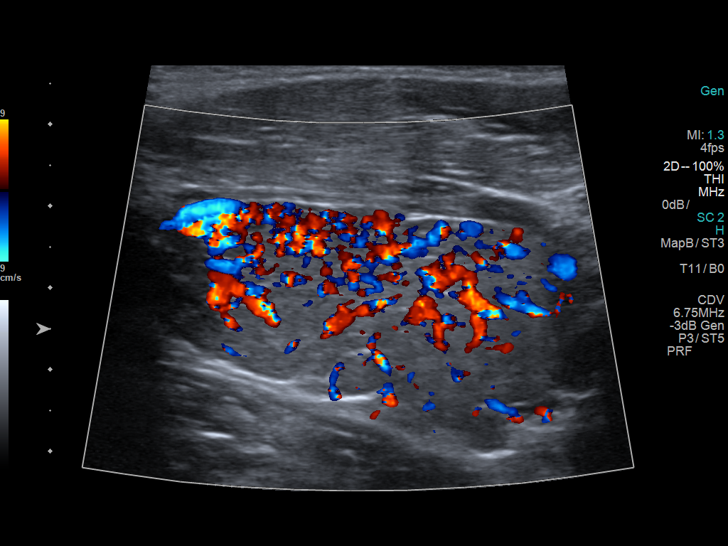
[im 32/48]
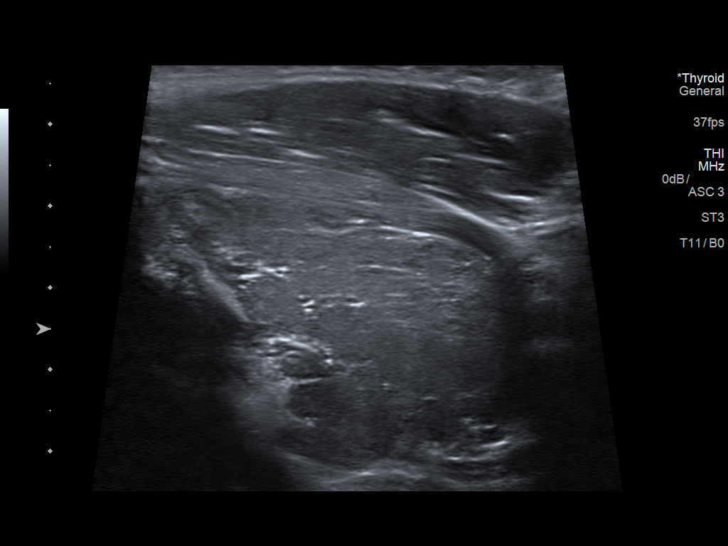
[im 36/48]
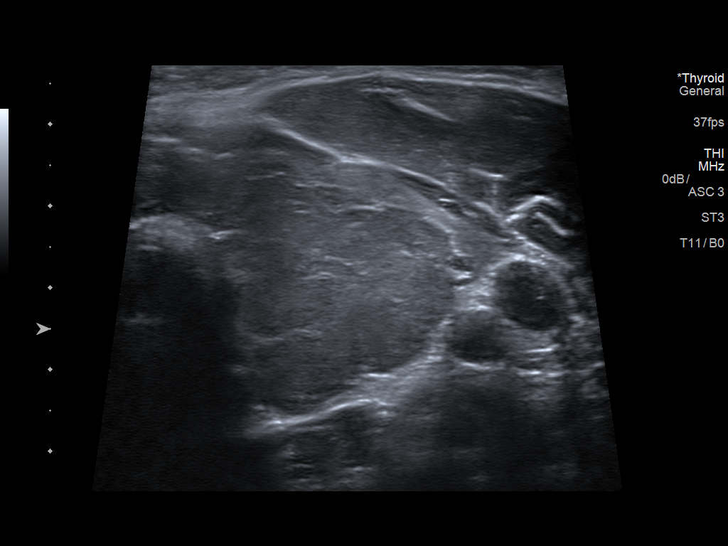
[im 40/48]
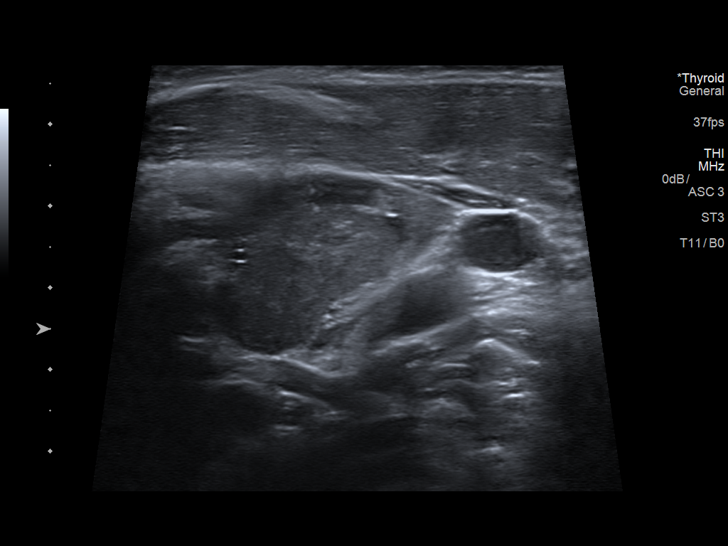
[im 44/48]
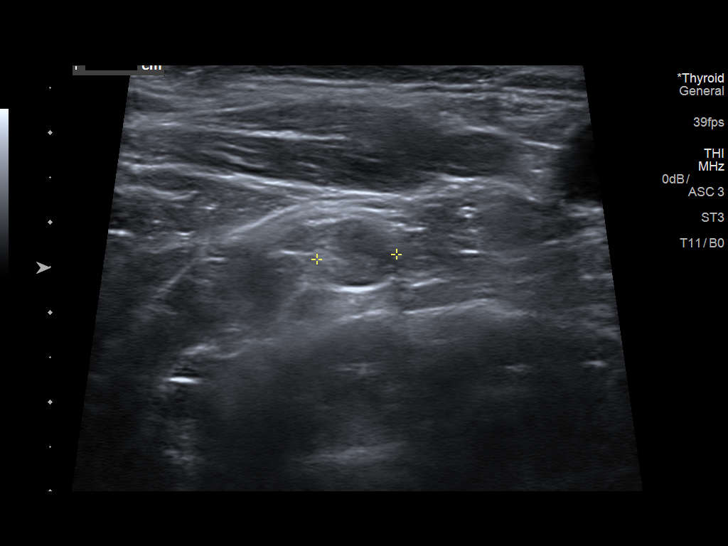
[im 48/48]
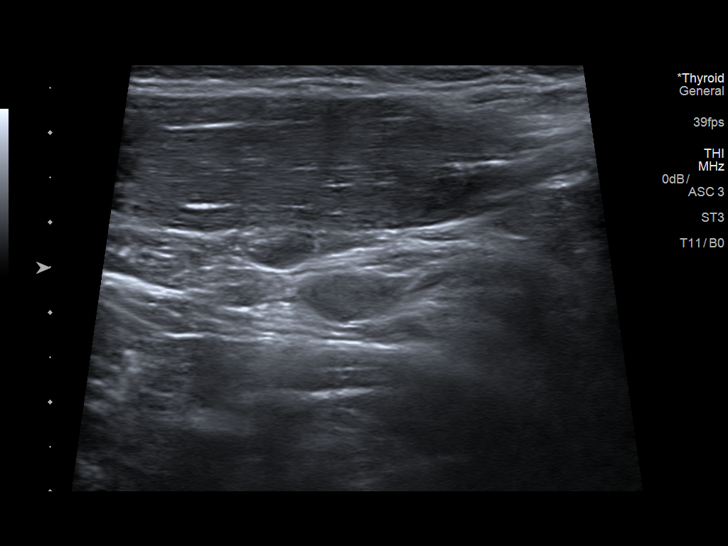

[14 of 25 positions shown; findings below may reference images not displayed]

FINDINGS: Right thyroid lobe

Measurements: 5.8 x 2.5 x 3.3 cm. Mildly enlarged diffusely
heterogeneous thyroid gland. No discrete nodules.

Left thyroid lobe

Measurements: 5.3 x 2.9 x 2.6 cm. Mildly enlarged diffusely
heterogeneous thyroid gland. No discrete nodules.

Isthmus

Thickness: 1.0 cm.  No nodules visualized.

Lymphadenopathy

None visualized.
IMPRESSION: Mildly enlarged and diffusely heterogeneous thyroid gland suggesting
chronic thyroiditis.

No discrete nodules.

## 2017-10-12 ENCOUNTER — Encounter: Payer: Self-pay | Admitting: Physician Assistant
